# Patient Record
Sex: Male | Born: 1950 | Race: Asian | Hispanic: No | Marital: Married | Smoking: Never smoker
Health system: Southern US, Community
[De-identification: ages and names within clinical notes are randomized; demographics above are authoritative.]

## PROBLEM LIST (undated history)

## (undated) DIAGNOSIS — E7800 Pure hypercholesterolemia, unspecified: Secondary | ICD-10-CM

## (undated) DIAGNOSIS — E119 Type 2 diabetes mellitus without complications: Secondary | ICD-10-CM

## (undated) DIAGNOSIS — I10 Essential (primary) hypertension: Secondary | ICD-10-CM

## (undated) DIAGNOSIS — I2510 Atherosclerotic heart disease of native coronary artery without angina pectoris: Secondary | ICD-10-CM

## (undated) HISTORY — PX: CORONARY ANGIOPLASTY WITH STENT PLACEMENT: SHX49

---

## 2016-11-05 ENCOUNTER — Emergency Department (HOSPITAL_COMMUNITY): Payer: Self-pay

## 2016-11-05 ENCOUNTER — Encounter (HOSPITAL_COMMUNITY): Payer: Self-pay | Admitting: *Deleted

## 2016-11-05 ENCOUNTER — Observation Stay (HOSPITAL_COMMUNITY)
Admission: EM | Admit: 2016-11-05 | Discharge: 2016-11-06 | Disposition: A | Discharge status: Home / Self Care | Payer: Self-pay | Attending: Internal Medicine | Admitting: Internal Medicine

## 2016-11-05 DIAGNOSIS — R079 Chest pain, unspecified: Principal | ICD-10-CM | POA: Insufficient documentation

## 2016-11-05 DIAGNOSIS — F419 Anxiety disorder, unspecified: Secondary | ICD-10-CM | POA: Insufficient documentation

## 2016-11-05 DIAGNOSIS — N289 Disorder of kidney and ureter, unspecified: Secondary | ICD-10-CM | POA: Insufficient documentation

## 2016-11-05 DIAGNOSIS — E119 Type 2 diabetes mellitus without complications: Secondary | ICD-10-CM | POA: Diagnosis not present

## 2016-11-05 DIAGNOSIS — E785 Hyperlipidemia, unspecified: Secondary | ICD-10-CM | POA: Insufficient documentation

## 2016-11-05 DIAGNOSIS — F32A Depression, unspecified: Secondary | ICD-10-CM | POA: Diagnosis present

## 2016-11-05 DIAGNOSIS — Z955 Presence of coronary angioplasty implant and graft: Secondary | ICD-10-CM | POA: Insufficient documentation

## 2016-11-05 DIAGNOSIS — E7800 Pure hypercholesterolemia, unspecified: Secondary | ICD-10-CM | POA: Insufficient documentation

## 2016-11-05 DIAGNOSIS — R918 Other nonspecific abnormal finding of lung field: Secondary | ICD-10-CM | POA: Insufficient documentation

## 2016-11-05 DIAGNOSIS — I10 Essential (primary) hypertension: Secondary | ICD-10-CM | POA: Insufficient documentation

## 2016-11-05 DIAGNOSIS — I25119 Atherosclerotic heart disease of native coronary artery with unspecified angina pectoris: Secondary | ICD-10-CM | POA: Diagnosis not present

## 2016-11-05 DIAGNOSIS — Z7982 Long term (current) use of aspirin: Secondary | ICD-10-CM | POA: Insufficient documentation

## 2016-11-05 DIAGNOSIS — F329 Major depressive disorder, single episode, unspecified: Secondary | ICD-10-CM | POA: Diagnosis not present

## 2016-11-05 DIAGNOSIS — I252 Old myocardial infarction: Secondary | ICD-10-CM | POA: Insufficient documentation

## 2016-11-05 DIAGNOSIS — Z7902 Long term (current) use of antithrombotics/antiplatelets: Secondary | ICD-10-CM | POA: Insufficient documentation

## 2016-11-05 DIAGNOSIS — Z8679 Personal history of other diseases of the circulatory system: Secondary | ICD-10-CM

## 2016-11-05 DIAGNOSIS — K219 Gastro-esophageal reflux disease without esophagitis: Secondary | ICD-10-CM | POA: Diagnosis present

## 2016-11-05 DIAGNOSIS — Z7984 Long term (current) use of oral hypoglycemic drugs: Secondary | ICD-10-CM | POA: Insufficient documentation

## 2016-11-05 DIAGNOSIS — I2510 Atherosclerotic heart disease of native coronary artery without angina pectoris: Secondary | ICD-10-CM | POA: Insufficient documentation

## 2016-11-05 HISTORY — DX: Essential (primary) hypertension: I10

## 2016-11-05 HISTORY — DX: Atherosclerotic heart disease of native coronary artery without angina pectoris: I25.10

## 2016-11-05 HISTORY — DX: Type 2 diabetes mellitus without complications: E11.9

## 2016-11-05 HISTORY — DX: Pure hypercholesterolemia, unspecified: E78.00

## 2016-11-05 LAB — CBC
HEMATOCRIT: 41.9 % (ref 39.0–52.0)
Hemoglobin: 13.6 g/dL (ref 13.0–17.0)
MCH: 27.1 pg (ref 26.0–34.0)
MCHC: 32.5 g/dL (ref 30.0–36.0)
MCV: 83.5 fL (ref 78.0–100.0)
Platelets: 275 10*3/uL (ref 150–400)
RBC: 5.02 MIL/uL (ref 4.22–5.81)
RDW: 13.7 % (ref 11.5–15.5)
WBC: 9.2 10*3/uL (ref 4.0–10.5)

## 2016-11-05 LAB — BASIC METABOLIC PANEL
Anion gap: 10 (ref 5–15)
BUN: 17 mg/dL (ref 6–20)
CHLORIDE: 106 mmol/L (ref 101–111)
CO2: 27 mmol/L (ref 22–32)
Calcium: 10 mg/dL (ref 8.9–10.3)
Creatinine, Ser: 1.3 mg/dL — ABNORMAL HIGH (ref 0.61–1.24)
GFR calc Af Amer: >60 mL/min (ref >60)
GFR calc non Af Amer: 56 mL/min — ABNORMAL LOW (ref >60)
GLUCOSE: 78 mg/dL (ref 65–99)
POTASSIUM: 3.6 mmol/L (ref 3.5–5.1)
Sodium: 143 mmol/L (ref 135–145)

## 2016-11-05 LAB — I-STAT TROPONIN, ED: Troponin i, poc: 0.01 ng/mL (ref 0.00–0.08)

## 2016-11-05 MED ORDER — ATORVASTATIN CALCIUM 80 MG PO TABS
80.0000 mg | ORAL_TABLET | Freq: Every day | ORAL | Status: DC
  Administered 2016-11-06: 80 mg via ORAL
  Filled 2016-11-05: qty 1

## 2016-11-05 MED ORDER — FAMOTIDINE IN NACL 20-0.9 MG/50ML-% IV SOLN
20.0000 mg | Freq: Two times a day (BID) | INTRAVENOUS | Status: DC
  Administered 2016-11-06 (×2): 20 mg via INTRAVENOUS
  Filled 2016-11-05 (×3): qty 50

## 2016-11-05 MED ORDER — ASPIRIN 81 MG PO CHEW
324.0000 mg | CHEWABLE_TABLET | Freq: Once | ORAL | Status: AC
  Administered 2016-11-05: 324 mg via ORAL
  Filled 2016-11-05: qty 4

## 2016-11-05 MED ORDER — LABETALOL HCL 5 MG/ML IV SOLN: 5.0000 mg | INTRAVENOUS | Status: DC | PRN

## 2016-11-05 MED ORDER — ISOSORBIDE MONONITRATE ER 30 MG PO TB24
30.0000 mg | ORAL_TABLET | Freq: Every day | ORAL | Status: DC
  Administered 2016-11-06: 30 mg via ORAL
  Filled 2016-11-05: qty 1

## 2016-11-05 MED ORDER — ESCITALOPRAM OXALATE 10 MG PO TABS
5.0000 mg | ORAL_TABLET | Freq: Every day | ORAL | Status: DC
  Administered 2016-11-06: 5 mg via ORAL
  Filled 2016-11-05: qty 1

## 2016-11-05 MED ORDER — NITROGLYCERIN 0.4 MG SL SUBL
0.4000 mg | SUBLINGUAL_TABLET | SUBLINGUAL | Status: DC | PRN
  Administered 2016-11-05: 0.4 mg via SUBLINGUAL
  Filled 2016-11-05: qty 1

## 2016-11-05 MED ORDER — METOPROLOL SUCCINATE ER 25 MG PO TB24
25.0000 mg | ORAL_TABLET | Freq: Every day | ORAL | Status: DC
  Administered 2016-11-06: 25 mg via ORAL
  Filled 2016-11-05 (×2): qty 1

## 2016-11-05 MED ORDER — NITROGLYCERIN 0.4 MG SL SUBL: 0.4000 mg | SUBLINGUAL_TABLET | SUBLINGUAL | Status: DC | PRN

## 2016-11-05 MED ORDER — ONDANSETRON HCL 4 MG/2ML IJ SOLN: 4.0000 mg | Freq: Four times a day (QID) | INTRAMUSCULAR | Status: DC | PRN

## 2016-11-05 MED ORDER — ASPIRIN EC 81 MG PO TBEC
81.0000 mg | DELAYED_RELEASE_TABLET | Freq: Every day | ORAL | Status: DC
  Administered 2016-11-06: 81 mg via ORAL
  Filled 2016-11-05: qty 1

## 2016-11-05 MED ORDER — ENOXAPARIN SODIUM 40 MG/0.4ML ~~LOC~~ SOLN
40.0000 mg | Freq: Every day | SUBCUTANEOUS | Status: DC
  Administered 2016-11-06: 40 mg via SUBCUTANEOUS
  Filled 2016-11-05: qty 0.4

## 2016-11-05 MED ORDER — CLOPIDOGREL BISULFATE 75 MG PO TABS
75.0000 mg | ORAL_TABLET | Freq: Every day | ORAL | Status: DC
  Administered 2016-11-06: 75 mg via ORAL
  Filled 2016-11-05: qty 1

## 2016-11-05 MED ORDER — INSULIN ASPART 100 UNIT/ML ~~LOC~~ SOLN: 0.0000 [IU] | Freq: Every day | SUBCUTANEOUS | Status: DC

## 2016-11-05 MED ORDER — ACETAMINOPHEN 325 MG PO TABS
650.0000 mg | ORAL_TABLET | ORAL | Status: DC | PRN
Start: 2016-11-05 — End: 2016-11-06

## 2016-11-05 MED ORDER — INSULIN ASPART 100 UNIT/ML ~~LOC~~ SOLN: 0.0000 [IU] | Freq: Three times a day (TID) | SUBCUTANEOUS | Status: DC

## 2016-11-05 NOTE — ED Triage Notes (Signed)
Pt is here from [place] visiting.  He gets his regular medical care [facility] in [place].  Pt began having left sided CP today with some sob and a little nausea.  Pt has HX of cardiac stent placement.

## 2016-11-05 NOTE — ED Notes (Signed)
Eula ListenHussain (son) [phone] requesting call when pt transferred and settled to floor

## 2016-11-05 NOTE — H&P (Signed)
History and Physical    Bruce Nash KYH:[id number] DOB: Dec 30, 1950 DOA: 11/05/2016  PCP: No PCP Per Patient   Patient coming from: Home  Chief Complaint: Chest pain   HPI: Bruce Nash is a 65 y.o. male with medical history significant for coronary artery disease, type 2 diabetes mellitus, hypertension, hyperlipidemia, depression, and GERD who presents to the emergency department with left-sided chest pain that began several hours prior. Patient reports that he is here visiting from [place] and was in his usual state of health until the sudden onset of left-sided chest pain with mild nausea and mild dyspnea. Patient describes the pain as moderate in intensity, heavy and pressure-like in character, radiating to the upper right back, and without diaphoresis. 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 He has occasional angina at home in [place] for which he takes nitroglycerin, but did not have any with him today and so he came into the ED for evaluation. Patient was unable to identify any alleviating or exacerbating factors for his pain and did not attempt any interventions prior to coming in. He denies any recent fevers or chills or any significant cough or dyspnea. There has been no extremity swelling, palpitations, or personal or family history of VTE.  ED Course: Upon arrival to the ED, patient is found to be afebrile, saturating well on room air, hypertensive to 170/105, and with vitals otherwise stable. EKG reveals a normal sinus rhythm and appears to be in the normal EKG. Chest x-ray is notable only for a mild nonspecific opacity in the left lateral lung base with short-term follow-up recommended. Chemistry panels notable for a serum creatinine of 1.30 with no prior available for comparison. CBC is unremarkable and troponin returns within the  normal limits at 0.01. Patient was treated with 324 mg aspirin in the emergency department and his pain, which had already been spontaneously improving, completely resolved. Patient has remained hemodynamically stable in the emergency department and there has been no apparent respiratory distress. Cardiology was consulted by the ED physician and advises a medical admission for ACS rule-out.  Review of Systems:  All other systems reviewed and apart from HPI, are negative.  Past Medical History:  Diagnosis Date  . CAD (coronary artery disease)   . Diabetes mellitus without complication ([place])   . Elevated cholesterol   . Hypertension     Past Surgical History:  Procedure Laterality Date  . CORONARY ANGIOPLASTY WITH STENT PLACEMENT       reports that he has never smoked. He has never used smokeless tobacco. He reports that he does not drink alcohol. His drug history is not on file.  No Known Allergies  History reviewed. No pertinent family history.   Prior to Admission medications   Medication Sig Start Date End Date Taking? Authorizing Provider  aspirin EC 81 MG tablet Take 81 mg by mouth daily.   Yes Historical Provider, MD  atorvastatin (LIPITOR) 80 MG tablet Take 80 mg by mouth daily.   Yes Historical Provider, MD  clopidogrel (PLAVIX) 75 MG tablet Take 75 mg by mouth daily.   Yes Historical Provider, MD  escitalopram (LEXAPRO) 5 MG tablet Take 5 mg by mouth daily.   Yes Historical Provider, MD  glipiZIDE (GLUCOTROL XL) 5 MG 24 hr tablet Take 5 mg by mouth daily with breakfast.   Yes Historical Provider, MD  isosorbide mononitrate (IMDUR) 30 MG 24 hr tablet Take 30 mg by mouth daily.   Yes Historical Provider, MD  lisinopril-hydrochlorothiazide (PRINZIDE,ZESTORETIC) 20-12.5 MG tablet Take 1 tablet by mouth daily.   Yes Historical Provider, MD  metFORMIN (GLUCOPHAGE) 500 MG tablet Take 500 mg by mouth 2 (two) times daily with a meal.   Yes Historical Provider, MD  metoprolol  succinate (TOPROL-XL) 25 MG 24 hr tablet Take 25 mg by mouth daily.   Yes Historical Provider, MD  omeprazole (PRILOSEC) 20 MG capsule Take 20 mg by mouth daily as needed (acid reflux).   Yes Historical Provider, MD    Physical Exam: Vitals:   11/05/16 2200 11/05/16 2215 11/05/16 2230 11/05/16 2245  BP: 160/91 141/90 155/97 146/88  Pulse: 89 92 91 88  Resp: _0 Temp:      TempSrc:      SpO2: 99% 95% 97% 100%  Weight:          Constitutional: NAD, calm, comfortable Eyes: PERTLA, lids and conjunctivae normal ENMT: Mucous membranes are moist. Posterior pharynx clear of any exudate or lesions.   Neck: normal, supple, no masses, no thyromegaly Respiratory: clear to auscultation bilaterally, no wheezing, no crackles. Normal respiratory effort.  Cardiovascular: S1 & S2 heard, regular rate and rhythm, soft systolic murmur at apex. No extremity edema. No significant JVD. Abdomen: No distension, no tenderness, no masses palpated. Bowel sounds normal.  Musculoskeletal: no clubbing / cyanosis. No joint deformity upper and lower extremities. Normal muscle tone.  Skin: no significant rashes, lesions, ulcers. Warm, dry, well-perfused. Neurologic: CN 2-12 grossly intact. Sensation intact, DTR normal. Strength 5/5 in all 4 limbs.  Psychiatric: Normal judgment and insight. Alert and oriented x 3. Normal mood and affect.     Labs on Admission: I have personally reviewed following labs and imaging studies  CBC:  Recent Labs Lab 11/05/16 2014  WBC 9.2  HGB 13.6  HCT 41.9  MCV 83.5  PLT 272   Basic Metabolic Panel:  Recent Labs Lab 11/05/16 2014  NA 143  K 3.6  CL 106  CO2 27  GLUCOSE 78  BUN 17  CREATININE 1.30*  CALCIUM 10.0   GFR: CrCl cannot be calculated (Unknown ideal weight.). Liver Function Tests: No results for input(s): AST, ALT, ALKPHOS, BILITOT, PROT, ALBUMIN in the last 168 hours. No results for input(s): LIPASE, AMYLASE in the last 168 hours. No  results for input(s): AMMONIA in the last 168 hours. Coagulation Profile: No results for input(s): INR, PROTIME in the last 168 hours. Cardiac Enzymes: No results for input(s): CKTOTAL, CKMB, CKMBINDEX, TROPONINI in the last 168 hours. BNP (last 3 results) No results for input(s): PROBNP in the last 8760 hours. HbA1C: No results for input(s): HGBA1C in the last 72 hours. CBG: No results for input(s): GLUCAP in the last 168 hours. Lipid Profile: No results for input(s): CHOL, HDL, LDLCALC, TRIG, CHOLHDL, LDLDIRECT in the last 72 hours. Thyroid Function Tests: No results for input(s): TSH, T4TOTAL, FREET4, T3FREE, THYROIDAB in the last 72 hours. Anemia Panel: No results for input(s): VITAMINB12, FOLATE, FERRITIN, TIBC, IRON, RETICCTPCT in the last 72 hours. Urine analysis: No results found for: COLORURINE, APPEARANCEUR, LABSPEC, PHURINE, GLUCOSEU, HGBUR, BILIRUBINUR, KETONESUR, PROTEINUR, UROBILINOGEN, NITRITE, LEUKOCYTESUR Sepsis Labs: _1 (procalcitonin:4,lacticidven:4) )No results found for this or any previous visit (from the past 240 hour(s)).   Radiological Exams on Admission: Dg Chest 2 View  Result Date: 11/05/2016 CLINICAL DATA:  Chest pain. EXAM: CHEST  2 VIEW COMPARISON:  None. FINDINGS: Scarring in the right apex. Mild opacity in the lateral left lung base is nonspecific but may represent scarring or atelectasis. The heart, hila, and mediastinum are normal. No other acute abnormalities. IMPRESSION: Mild nonspecific opacity in the lateral left lung base. Recommend comparison to outside films if available to assess stability. If none are available, recommend a short-term follow-up. Probable scarring in the right apex. Electronically Signed   By: Dorise Bullion III M.D   On: 11/05/2016 20:50    EKG: Independently reviewed. Normal sinus rhythm, normal EKG  Assessment/Plan  1. Chest pain, CAD  - Pt reports hx of CAD with stent placed 3 yrs ago and 18 mos ago in [place] he [place]  - He is managed at home with ASA 81, Plavix, Lipitor, Imdur, lisinopril, and Toprol - He developed CP several hrs PTA, described as very similar as sxs experienced with MI - Initial EKG and troponin are reassuring; no acute ischemic features to EKG and troponin is 0.01 - ASA 324 mg given in ED  - Cardiology is consulting and much appreciated, will follow-up recommendations  - Plan to monitor on telemetry for ischemic changes, obtain serial troponin measurements, repeat EKG in am (sooner for recurrent pain), and continue ASA, Plavix, statin, nitrate, and metoprolol    2. Hypertension  - Modestly elevated in ED, will treat now with beta-blocker  - Managed at home with Toprol, lisinopril-HCTZ; lisinopril-HCTZ held on admission given kidney disease of uncertain chronicity  - Labetalol IVPs available prn   3. Type II DM  - No A1c on file  - Managed with metformin and glipizide at home; these are held on admission  - Check CBG with meals and qHS - Start with a low-intensity SSI only, adjust prn   4. Kidney disease of uncertain chronicity - SCr is 1.30 on admission, corresponding to eGFR in mid 50's; no prior available  - Pt appears euvolemic on admission; plan to hold lisinopril and HCTZ for now and repeat chemistries in am    5. GERD - Managed with Prilosec at home - Will treat with IV Pepcid now given possible GI etiology for presenting complaints   6. Depression, anxiety  - Appears to be stable on admission; pt reports is stable  - Continue Lexapro    7. Non-specific opacity on CXR  - There is a subtle opacity in left lateral lung base on CXR with no priors available for comparison  - Discussed with patient that he will need close follow-up on this; he expresses understanding   DVT prophylaxis: sq Lovenox  Code Status: Full  Family Communication: Discussed with patient Disposition Plan: Observe on telemetry Consults called: Cardiology  Admission status:  Observation    Vianne Bulls, MD [facility] Pager [phone]  If 7PM-7AM, please contact night-coverage [email or web address] Password [facility]  11/05/2016, 11:27 PM

## 2016-11-05 NOTE — ED Provider Notes (Signed)
[facility]-EMERGENCY DEPT Provider Note   CSN: [id number] Arrival date & time: 11/05/16  2001     History   Chief Complaint Chief Complaint  Patient presents with  . Chest Pain    HPI Bruce Nash is a 65 y.o. male.  The history is provided by the patient, a relative and medical records. No language interpreter was used.  Chest Pain   This is a recurrent (states feels similar to prior ACS, has history of stents) problem. The current episode started 3 to 5 hours ago. The problem has not changed since onset.The pain is associated with rest. The pain is present in the substernal region (L side). The pain is moderate. The quality of the pain is described as heavy and pressure-like. The pain radiates to the upper back. Associated symptoms include nausea and shortness of breath. Pertinent negatives include no abdominal pain, no cough, no dizziness, no fever, no headaches, no lower extremity edema, no numbness, no syncope and no vomiting. He has tried nothing (was at [employer] first birthday party and did not have nitro with him) for the symptoms. Risk factors include male gender.  His past medical history is significant for CAD.  Procedure history is positive for cardiac catheterization (has prior stents placed at [facility]).    Past Medical History:  Diagnosis Date  . CAD (coronary artery disease)   . Diabetes mellitus without complication ([facility])   . Elevated cholesterol   . Hypertension     Patient Active Problem List   Diagnosis Date Noted  . Chest pain 11/05/2016  . CAD (coronary artery disease) 11/05/2016  . HLD (hyperlipidemia) 11/05/2016  . HTN (hypertension) 11/05/2016  . Kidney disease 11/05/2016  . Opacity of lung on imaging study 11/05/2016  . Depression 11/05/2016  . GERD (gastroesophageal reflux disease) 11/05/2016  . Diabetes mellitus type II, non insulin dependent (HCC) 11/05/2016    Past Surgical History:  Procedure Laterality Date  . CORONARY  ANGIOPLASTY WITH STENT PLACEMENT         Home Medications    Prior to Admission medications   Medication Sig Start Date End Date Taking? Authorizing Provider  aspirin EC 81 MG tablet Take 81 mg by mouth daily.   Yes Historical Provider, MD  atorvastatin (LIPITOR) 80 MG tablet Take 80 mg by mouth daily.   Yes Historical Provider, MD  clopidogrel (PLAVIX) 75 MG tablet Take 75 mg by mouth daily.   Yes Historical Provider, MD  escitalopram (LEXAPRO) 5 MG tablet Take 5 mg by mouth daily.   Yes Historical Provider, MD  glipiZIDE (GLUCOTROL XL) 5 MG 24 hr tablet Take 5 mg by mouth daily with breakfast.   Yes Historical Provider, MD  isosorbide mononitrate (IMDUR) 30 MG 24 hr tablet Take 30 mg by mouth daily.   Yes Historical Provider, MD  lisinopril-hydrochlorothiazide (PRINZIDE,ZESTORETIC) 20-12.5 MG tablet Take 1 tablet by mouth daily.   Yes Historical Provider, MD  metFORMIN (GLUCOPHAGE) 500 MG tablet Take 500 mg by mouth 2 (two) times daily with a meal.   Yes Historical Provider, MD  metoprolol succinate (TOPROL-XL) 25 MG 24 hr tablet Take 25 mg by mouth daily.   Yes Historical Provider, MD  omeprazole (PRILOSEC) 20 MG capsule Take 20 mg by mouth daily as needed (acid reflux).   Yes Historical Provider, MD    Family History History reviewed. No pertinent family history.  Social History Social History  Substance Use Topics  . Smoking status: Never Smoker  . Smokeless tobacco: Never  Used  . Alcohol use No     Allergies   Patient has no known allergies.   Review of Systems Review of Systems  Constitutional: Negative for chills and fever.  HENT: Negative for congestion and rhinorrhea.   Respiratory: Positive for shortness of breath. Negative for cough.   Cardiovascular: Positive for chest pain. Negative for syncope.  Gastrointestinal: Positive for nausea. Negative for abdominal pain and vomiting.  Genitourinary: Negative for dysuria and hematuria.  Skin: Negative for pallor.    Neurological: Negative for dizziness, numbness and headaches.  Psychiatric/Behavioral: Negative for agitation and confusion.  All other systems reviewed and are negative.    Physical Exam Updated Vital Signs BP 122/70   Pulse 103   Temp 98 F (36.7 C) (Oral)   Resp 18   Wt 76.2 kg   SpO2 100%   Physical Exam  Constitutional: He is oriented to person, place, and time. No distress.  HENT:  Head: Normocephalic and atraumatic.  Eyes: Conjunctivae and EOM are normal.  Neck: Normal range of motion. Neck supple.  Cardiovascular: Normal rate, regular rhythm and normal heart sounds.   Pulmonary/Chest: Breath sounds normal. No respiratory distress.  Abdominal: Soft. He exhibits no distension. There is no tenderness.  Musculoskeletal: Normal range of motion. He exhibits no edema.  Neurological: He is alert and oriented to person, place, and time.  Skin: Skin is warm and dry. He is not diaphoretic.  Nursing note and vitals reviewed.    ED Treatments / Results  Labs (all labs ordered are listed, but only abnormal results are displayed) Labs Reviewed  BASIC METABOLIC PANEL - Abnormal; Notable for the following:       Result Value   Creatinine, Ser 1.30 (*)    GFR calc non Af Amer 56 (*)    All other components within normal limits  CBC  TROPONIN I  TROPONIN I  TROPONIN I  BRAIN NATRIURETIC PEPTIDE  BASIC METABOLIC PANEL  I-STAT TROPOININ, ED    EKG  EKG Interpretation  Date/Time:  Saturday November 05 2016 20:07:06 EST Ventricular Rate:  83 PR Interval:  156 QRS Duration: 74 QT Interval:  342 QTC Calculation: 401 R Axis:   76 Text Interpretation:  Normal sinus rhythm Normal ECG Normal ECG Confirmed by Gerhard Munch  MD ([id number]) on 11/05/2016 8:36:11 PM       Radiology Dg Chest 2 View  Result Date: 11/05/2016 CLINICAL DATA:  Chest pain. EXAM: CHEST  2 VIEW COMPARISON:  None. FINDINGS: Scarring in the right apex. Mild opacity in the lateral left lung base is  nonspecific but may represent scarring or atelectasis. The heart, hila, and mediastinum are normal. No other acute abnormalities. IMPRESSION: Mild nonspecific opacity in the lateral left lung base. Recommend comparison to outside films if available to assess stability. If none are available, recommend a short-term follow-up. Probable scarring in the right apex. Electronically Signed   By: Gerome Sam III M.D   On: 11/05/2016 20:50    Procedures Procedures (including critical care time)  Medications Ordered in ED Medications  aspirin EC tablet 81 mg (not administered)  atorvastatin (LIPITOR) tablet 80 mg (not administered)  clopidogrel (PLAVIX) tablet 75 mg (not administered)  escitalopram (LEXAPRO) tablet 5 mg (not administered)  isosorbide mononitrate (IMDUR) 24 hr tablet 30 mg (not administered)  metoprolol succinate (TOPROL-XL) 24 hr tablet 25 mg (not administered)  nitroGLYCERIN (NITROSTAT) SL tablet 0.4 mg (not administered)  acetaminophen (TYLENOL) tablet 650 mg (not administered)  ondansetron (ZOFRAN) injection  4 mg (not administered)  enoxaparin (LOVENOX) injection 40 mg (not administered)  insulin aspart (novoLOG) injection 0-9 Units (not administered)  insulin aspart (novoLOG) injection 0-5 Units (not administered)  labetalol (NORMODYNE,TRANDATE) injection 5 mg (not administered)  famotidine (PEPCID) IVPB 20 mg premix (not administered)  aspirin chewable tablet 324 mg (324 mg Oral Given 11/05/16 2125)     Initial Impression / Assessment and Plan / ED Course  I have reviewed the triage vital signs and the nursing notes.  Pertinent labs & imaging results that were available during my care of the patient were reviewed by me and considered in my medical decision making (see chart for details).  Clinical Course      Patient is a 65 year old male with a past medical history of coronary artery disease who presents today with chest pain that started earlier this evening. On  my exam, the patient appears slightly uncomfortable but is in no acute distress. I reviewed his EKG and there is no evidence of ST elevation, depression, or pathological T-wave inversions. He is in normal sinus rhythm. He has normal R-wave progression. Normal axis.  Given his high risk I discussed with cardiology. They will see the patient tonight consultation and recommended inpatient evaluation for ACS. I discussed this with the patient was agreeable. Discussed with hospitalist who will that the patient to a telemetry bed. Patient is stable at time of admission.  Final Clinical Impressions(s) / ED Diagnoses   Final diagnoses:  None    New Prescriptions New Prescriptions   No medications on file     Madolyn FriezeVijay Vandy Fong, MD 11/06/16 0025    Gerhard Munchobert Lockwood, MD 11/06/16 0028

## 2016-11-06 DIAGNOSIS — N289 Disorder of kidney and ureter, unspecified: Secondary | ICD-10-CM

## 2016-11-06 DIAGNOSIS — E119 Type 2 diabetes mellitus without complications: Secondary | ICD-10-CM

## 2016-11-06 DIAGNOSIS — Z8679 Personal history of other diseases of the circulatory system: Secondary | ICD-10-CM

## 2016-11-06 DIAGNOSIS — E785 Hyperlipidemia, unspecified: Secondary | ICD-10-CM

## 2016-11-06 DIAGNOSIS — R918 Other nonspecific abnormal finding of lung field: Secondary | ICD-10-CM

## 2016-11-06 DIAGNOSIS — R079 Chest pain, unspecified: Secondary | ICD-10-CM

## 2016-11-06 DIAGNOSIS — I10 Essential (primary) hypertension: Secondary | ICD-10-CM

## 2016-11-06 DIAGNOSIS — I25119 Atherosclerotic heart disease of native coronary artery with unspecified angina pectoris: Secondary | ICD-10-CM

## 2016-11-06 DIAGNOSIS — I2510 Atherosclerotic heart disease of native coronary artery without angina pectoris: Secondary | ICD-10-CM

## 2016-11-06 LAB — TROPONIN I: Troponin I: <0.03 ng/mL (ref <0.03)

## 2016-11-06 LAB — BRAIN NATRIURETIC PEPTIDE: B NATRIURETIC PEPTIDE 5: 70.3 pg/mL (ref 0.0–100.0)

## 2016-11-06 LAB — BASIC METABOLIC PANEL
ANION GAP: 11 (ref 5–15)
BUN: 14 mg/dL (ref 6–20)
CALCIUM: 9 mg/dL (ref 8.9–10.3)
CO2: 20 mmol/L — ABNORMAL LOW (ref 22–32)
Chloride: 107 mmol/L (ref 101–111)
Creatinine, Ser: 1.05 mg/dL (ref 0.61–1.24)
GFR calc Af Amer: >60 mL/min (ref >60)
GLUCOSE: 81 mg/dL (ref 65–99)
POTASSIUM: 3.5 mmol/L (ref 3.5–5.1)
SODIUM: 138 mmol/L (ref 135–145)

## 2016-11-06 LAB — GLUCOSE, CAPILLARY
GLUCOSE-CAPILLARY: 153 mg/dL — AB (ref 65–99)
Glucose-Capillary: 95 mg/dL (ref 65–99)
Glucose-Capillary: 68 mg/dL (ref 65–99)

## 2016-11-06 MED ORDER — NITROGLYCERIN 0.4 MG SL SUBL: 0.4000 mg | SUBLINGUAL_TABLET | SUBLINGUAL | 0 refills | Status: AC | PRN

## 2016-11-06 NOTE — ED Notes (Signed)
Report called and given to 2W

## 2016-11-06 NOTE — Consult Note (Signed)
Patient ID: Linden DolinShabbir Yarborough MRN: [id number], DOB/AGE: 1951/09/07  Admit date: 11/05/2016 Primary Physician: No PCP Per Patient  CARDIOLOGY CONSULT NOTE   PATIENT PROFILE   65 yo M with history of CAD, TMII, HTN, HLD who p/w chest pain. Patient resides in [place] and is here visiting family. He states he had PCI x 2 - once three years ago, and again two years ago. He does not know the location of the stents or the type. These were performed at [facility] in [place].   HISTORY OF PRESENT ILLNESS   He tells me that today while at a party for his family member he experienced sudden onset L sided chest pain with mild diaphoresis. No radiation. He felt a bit unwell so he decided to lay down for a couple hours, but when he awoke the chest discomfort was still there. At that time he called EMS. After Aspirin and Nitro x 1, chest pain resolved (lasted a total of 4-5 hours). He normally has nitro with him, but he had left it at home. It has been several months since he needed nitro. He states that a few months ago [facility] with similar chest pain, and was ruled out after a normal stress test (no repeat cath). He states that his symptoms today were similar to his previous episodes for which he received PCI except the chest pain was not as intense, and with those episodes he had much more severe nausea.  At baseline he is very active, walking 3-4 miles daily. He also frequently has to walk stairs. He has no angina usually. No limitations to his exertional status. No issues with bleeding (still on DAPT). No syncope, presyncope, orthopnea, palpitations, LE edema.  In ED, troponin was negative and ECG showed no ischemic changes. He is electrically and hemodynamically stable.  Review of Systems General:  No chills, fever, night sweats or weight changes.  Cardiovascular:  See HPI Dermatological: No rash, lesions/masses Respiratory: No cough, dyspnea Urologic: No hematuria,  dysuria Abdominal:   No nausea, vomiting, diarrhea, bright red blood per rectum, melena, or hematemesis Neurologic:  No visual changes, wkns, changes in mental status. All other systems reviewed and are otherwise negative except as noted above.   MEDICAL, FAMILY, AND SOCIAL HISTORY   Past Medical History:  Diagnosis Date  . CAD (coronary artery disease)   . Diabetes mellitus without complication (HCC)   . Elevated cholesterol   . Hypertension     Past Surgical History:  Procedure Laterality Date  . CORONARY ANGIOPLASTY WITH STENT PLACEMENT      No Known Allergies Prior to Admission medications   Medication Sig Start Date End Date Taking? Authorizing Provider  aspirin EC 81 MG tablet Take 81 mg by mouth daily.   Yes Historical Provider, MD  atorvastatin (LIPITOR) 80 MG tablet Take 80 mg by mouth daily.   Yes Historical Provider, MD  clopidogrel (PLAVIX) 75 MG tablet Take 75 mg by mouth daily.   Yes Historical Provider, MD  escitalopram (LEXAPRO) 5 MG tablet Take 5 mg by mouth daily.   Yes Historical Provider, MD  glipiZIDE (GLUCOTROL XL) 5 MG 24 hr tablet Take 5 mg by mouth daily with breakfast.   Yes Historical Provider, MD  isosorbide mononitrate (IMDUR) 30 MG 24 hr tablet Take 30 mg by mouth daily.   Yes Historical Provider, MD  lisinopril-hydrochlorothiazide (PRINZIDE,ZESTORETIC) 20-12.5 MG tablet Take 1 tablet by mouth daily.   Yes Historical Provider, MD  metFORMIN (GLUCOPHAGE) 500 MG tablet Take 500 mg by mouth 2 (two) times daily with a meal.   Yes Historical Provider, MD  metoprolol succinate (TOPROL-XL) 25 MG 24 hr tablet Take 25 mg by mouth daily.   Yes Historical Provider, MD  omeprazole (PRILOSEC) 20 MG capsule Take 20 mg by mouth daily as needed (acid reflux).   Yes Historical Provider, MD   History reviewed. No pertinent family history. Social History   Social History  . Marital status: Married    Spouse name: N/A  . Number of children: N/A  . Years of education:  N/A   Occupational History  . Not on file.   Social History Main Topics  . Smoking status: Never Smoker  . Smokeless tobacco: Never Used  . Alcohol use No  . Drug use: Unknown  . Sexual activity: Not on file   Other Topics Concern  . Not on file   Social History Narrative  . No narrative on file      PHYSICAL EXAM  Blood pressure 131/69, pulse 98, temperature 97.6 F (36.4 C), temperature source Axillary, resp. rate 16, weight 75.2 kg (165 lb 11.2 oz), SpO2 100 %.  General: Pleasant, NAD Psych: Normal affect. Neuro: Alert and oriented X 3. Moves all extremities spontaneously. HEENT: Sclera anicteric, noninjected. MMM.  Neck: no bruits. JVP is not elevated. Lungs:  CTA, normal WOB Heart: RRR no m/r/g Abdomen: Soft, non-tender, non-distended, BS + x 4.  Extremities: No clubbing or cyanosis. Edema: no. DP/PT/Radials 2+ and equal bilaterally.   LABS and STUDIES  Troponin (Point of Care Test)  Recent Labs  11/05/16 2038  TROPIPOC 0.01   No results for input(s): CKTOTAL, CKMB, TROPONINI in the last 72 hours. Lab Results  Component Value Date   WBC 9.2 11/05/2016   HGB 13.6 11/05/2016   HCT 41.9 11/05/2016   MCV 83.5 11/05/2016   PLT 275 11/05/2016    Recent Labs Lab 11/05/16 2014  NA 143  K 3.6  CL 106  CO2 27  BUN 17  CREATININE 1.30*  CALCIUM 10.0  GLUCOSE 78   No results found for: CHOL, HDL, LDLCALC, TRIG No results found for: DDIMER   Radiology/Studies. Independently Reviewed Opacity in left lung base. Otherwise clear lung fields.  ECG was independently reviewed. NSR 83 bpm, no significant abnormalities   ASSESSMENT and RECOMMENDATIONS   65 yo M with history of CAD, TMII, HTN, HLD who p/w chest pain  Chest Pain. Now chest pain free. Hemodynamically and electrically stable. Agree with obs admission for ACS rule out. - Lovenox for anticoag - Trend troponin - Repeat ECG for chest pain - Continue DAPT (ASA, Plavix), Atorvastatin, Imdur,  metoprolol - Checking HgbA1C - Trending renal function given Cr 1.3 on admission (no priors available). Holding Lisinopril - Please obtain outside cardiology records if able. If patient has normal recent stress test, this may influence whether or not he needs ischemic evaluation - NPO at midnight   PAULEY, ERIC D, MD 11/06/2016, 1:33 AM

## 2016-11-06 NOTE — Discharge Summary (Signed)
PATIENT DETAILS Name: Bruce Nash Age: 65 y.o. Sex: male Date of Birth: 07/19/1951 MRN: [id number]. Admitting Physician: Briscoe Deutscher, MD PCP:No PCP Per Patient  Admit Date: 11/05/2016 Discharge date: 11/06/2016  Recommendations for Outpatient Follow-up:  1. Follow up with PCP and primary cardiologist in 1 week 2. Repeat CXR in 3-4 weeks  Admitted From:  Home  Disposition: Home  Home Health: No  Equipment/Devices: None  Discharge Condition: Stable  CODE STATUS: FULL CODE  Diet recommendation:  Heart Healthy / Carb Modified  Brief Summary: See H&P, Labs, Consult and Test reports for all details in brief, patient is a 65 yo male with hx of CAD-s/p PCI in [place] presented with chest pain.   Brief Hospital Course: Chest pain:known hx of CAD-no longer with chest pain. Troponin's were negative.Continue with ASA/Plavix/Statin/Metoprolol.Seen by cardiology-per cardiology note-patient not interested in pursuing any further intervention, per cardiology ok to discharge today.Continue with ASA/Plavix/Statin/Metoprolol.Patient instructed to seek immediate medical attention at the nearest ED if his chest pain recurs.  WUJ:[id number] Metoprolol,Imdur  Type 2 DM: CBG's stable-continue SSI-continue to hold Glipizide  Mild opacity in the lateral left lung base: per Radiology report likely scarring-will need repeart CXR or a CT Chest in 3-4 weeks.Currently no indication of infection.Patient was apprised of this finding and recommendation to repeat imaging  Procedures/Studies: None  Discharge Diagnoses:  Principal Problem:   Chest pain Active Problems:   CAD (coronary artery disease)   HLD (hyperlipidemia)   HTN (hypertension)   Kidney disease   Opacity of lung on imaging study   Depression   GERD (gastroesophageal reflux disease)   Diabetes mellitus type II, non insulin dependent (HCC)   History of coronary artery disease   Discharge  Instructions:  Activity:  As tolerated with Full fall precautions use walker/cane & assistance as needed  Discharge Instructions    Call MD for:  severe uncontrolled pain    Complete by:  As directed    Diet - low sodium heart healthy    Complete by:  As directed    Diet Carb Modified    Complete by:  As directed    Discharge instructions    Complete by:  As directed    Follow with Primary MD and Primary Cardiologist in [place]    If you have chest pain again-please seek immediate medical attention at the nearest emergency room  Your chest xray showed mild opacity in the lateral left lung base: per Radiology report likely scarring-please ask your primary MD to need repeart CXR or a CT Chest in 3-4  weeks.  Please get a complete blood count and chemistry panel checked by your Primary MD at your next visit, and again as instructed by your Primary MD.  Get Medicines reviewed and adjusted: Please take all your medications with you for your next visit with your Primary MD  Laboratory/radiological data: Please request your Primary MD to go over all hospital tests and procedure/radiological results at the follow up, please ask your Primary MD to get all Hospital records sent to his/her office.  In some cases, they will be blood work, cultures and biopsy results pending at the time of your discharge. Please request that your primary care M.D. follows up on these results.  Also Note the following: If you experience worsening of your admission symptoms, develop shortness of breath, life threatening emergency, suicidal or homicidal thoughts you must seek medical attention immediately by calling 911 or calling your MD immediately  if symptoms less severe.  You must read complete instructions/literature along with all the possible adverse reactions/side effects for all the Medicines you take and that have been prescribed to you. Take any new Medicines after you have completely understood and accpet  all the possible adverse reactions/side effects.   Do not drive when taking Pain medications or sleeping medications (Benzodaizepines)  Do not take more than prescribed Pain, Sleep and Anxiety Medications. It is not advisable to combine anxiety,sleep and pain medications without talking with your primary care practitioner  Special Instructions: If you have smoked or chewed Tobacco  in the last 2 yrs please stop smoking, stop any regular Alcohol  and or any Recreational drug use.  Wear Seat belts while driving.  Please note: You were cared for by a hospitalist during your hospital stay. Once you are discharged, your primary care physician will handle any further medical issues. Please note that NO REFILLS for any discharge medications will be authorized once you are discharged, as it is imperative that you return to your primary care physician (or establish a relationship with a primary care physician if you do not have one) for your post hospital discharge needs so that they can reassess your need for medications and monitor your lab values.   Increase activity slowly    Complete by:  As directed      Allergies as of 11/06/2016   No Known Allergies     Medication List    TAKE these medications   aspirin EC 81 MG tablet Take 81 mg by mouth daily.   atorvastatin 80 MG tablet Commonly known as:  LIPITOR Take 80 mg by mouth daily.   clopidogrel 75 MG tablet Commonly known as:  PLAVIX Take 75 mg by mouth daily.   escitalopram 5 MG tablet Commonly known as:  LEXAPRO Take 5 mg by mouth daily.   glipiZIDE 5 MG 24 hr tablet Commonly known as:  GLUCOTROL XL Take 5 mg by mouth daily with breakfast.   isosorbide mononitrate 30 MG 24 hr tablet Commonly known as:  IMDUR Take 30 mg by mouth daily.   lisinopril-hydrochlorothiazide 20-12.5 MG tablet Commonly known as:  PRINZIDE,ZESTORETIC Take 1 tablet by mouth daily.   metFORMIN 500 MG tablet Commonly known as:  GLUCOPHAGE Take  500 mg by mouth 2 (two) times daily with a meal.   metoprolol succinate 25 MG 24 hr tablet Commonly known as:  TOPROL-XL Take 25 mg by mouth daily.   nitroGLYCERIN 0.4 MG SL tablet Commonly known as:  NITROSTAT Place 1 tablet (0.4 mg total) under the tongue every 5 (five) minutes x 3 doses as needed for chest pain.   omeprazole 20 MG capsule Commonly known as:  PRILOSEC Take 20 mg by mouth daily as needed (acid reflux).      Follow-up Information    PCP/Cardiology in [place]. Schedule an appointment as soon as possible for a visit in 2 week(s).          No Known Allergies  Consultations:   cardiology   Other Procedures/Studies: Dg Chest 2 View  Result Date: 11/05/2016 CLINICAL DATA:  Chest pain. EXAM: CHEST  2 VIEW COMPARISON:  None. FINDINGS: Scarring in the right apex. Mild opacity in the lateral left lung base is nonspecific but may represent scarring or atelectasis. The heart, hila, and mediastinum are normal. No other acute abnormalities. IMPRESSION: Mild nonspecific opacity in the lateral left lung base. Recommend comparison to outside films if available to assess stability. If none are available, recommend a  short-term follow-up. Probable scarring in the right apex. Electronically Signed   By: Gerome Sam III M.D   On: 11/05/2016 20:50    TODAY-DAY OF DISCHARGE:  Subjective:   Bruce Nash today has no headache,no chest abdominal pain,no new weakness tingling or numbness, feels much better wants to go home today.   Objective:   Blood pressure 116/68, pulse 76, temperature 98 F (36.7 C), temperature source Oral, resp. rate 16, weight 75.2 kg (165 lb 11.2 oz), SpO2 100 %. No intake or output data in the 24 hours ending 11/06/16 1314 Filed Weights   11/05/16 2011 11/06/16 0055  Weight: 76.2 kg (168 lb) 75.2 kg (165 lb 11.2 oz)    Exam: Awake Alert, Oriented *3, No new F.N deficits, Normal affect [place].AT,PERRAL Supple Neck,No JVD, No cervical lymphadenopathy  appriciated.  Symmetrical Chest wall movement, Good air movement bilaterally, CTAB RRR,No Gallops,Rubs or new Murmurs, No Parasternal Heave +ve B.Sounds, Abd Soft, Non tender, No organomegaly appriciated, No rebound -guarding or rigidity. No Cyanosis, Clubbing or edema, No new Rash or bruise   PERTINENT RADIOLOGIC STUDIES: Dg Chest 2 View  Result Date: 11/05/2016 CLINICAL DATA:  Chest pain. EXAM: CHEST  2 VIEW COMPARISON:  None. FINDINGS: Scarring in the right apex. Mild opacity in the lateral left lung base is nonspecific but may represent scarring or atelectasis. The heart, hila, and mediastinum are normal. No other acute abnormalities. IMPRESSION: Mild nonspecific opacity in the lateral left lung base. Recommend comparison to outside films if available to assess stability. If none are available, recommend a short-term follow-up. Probable scarring in the right apex. Electronically Signed   By: Gerome Sam III M.D   On: 11/05/2016 20:50     PERTINENT LAB RESULTS: CBC:  Recent Labs  11/05/16 2014  WBC 9.2  HGB 13.6  HCT 41.9  PLT 275   CMET CMP     Component Value Date/Time   NA 138 11/06/2016 0432   K 3.5 11/06/2016 0432   CL 107 11/06/2016 0432   CO2 20 (L) 11/06/2016 0432   GLUCOSE 81 11/06/2016 0432   BUN 14 11/06/2016 0432   CREATININE 1.05 11/06/2016 0432   CALCIUM 9.0 11/06/2016 0432   GFRNONAA >60 11/06/2016 0432   GFRAA >60 11/06/2016 0432    GFR CrCl cannot be calculated (Unknown ideal weight.). No results for input(s): LIPASE, AMYLASE in the last 72 hours.  Recent Labs  11/06/16 0145 11/06/16 0432 11/06/16 1055  TROPONINI <0.03 <0.03 <0.03   Invalid input(s): POCBNP No results for input(s): DDIMER in the last 72 hours. No results for input(s): HGBA1C in the last 72 hours. No results for input(s): CHOL, HDL, LDLCALC, TRIG, CHOLHDL, LDLDIRECT in the last 72 hours. No results for input(s): TSH, T4TOTAL, T3FREE, THYROIDAB in the last 72  hours.  Invalid input(s): FREET3 No results for input(s): VITAMINB12, FOLATE, FERRITIN, TIBC, IRON, RETICCTPCT in the last 72 hours. Coags: No results for input(s): INR in the last 72 hours.  Invalid input(s): PT Microbiology: No results found for this or any previous visit (from the past 240 hour(s)).  FURTHER DISCHARGE INSTRUCTIONS:  Get Medicines reviewed and adjusted: Please take all your medications with you for your next visit with your Primary MD  Laboratory/radiological data: Please request your Primary MD to go over all hospital tests and procedure/radiological results at the follow up, please ask your Primary MD to get all Hospital records sent to his/her office.  In some cases, they will be blood work, cultures and  biopsy results pending at the time of your discharge. Please request that your primary care M.D. goes through all the records of your hospital data and follows up on these results.  Also Note the following: If you experience worsening of your admission symptoms, develop shortness of breath, life threatening emergency, suicidal or homicidal thoughts you must seek medical attention immediately by calling 911 or calling your MD immediately  if symptoms less severe.  You must read complete instructions/literature along with all the possible adverse reactions/side effects for all the Medicines you take and that have been prescribed to you. Take any new Medicines after you have completely understood and accpet all the possible adverse reactions/side effects.   Do not drive when taking Pain medications or sleeping medications (Benzodaizepines)  Do not take more than prescribed Pain, Sleep and Anxiety Medications. It is not advisable to combine anxiety,sleep and pain medications without talking with your primary care practitioner  Special Instructions: If you have smoked or chewed Tobacco  in the last 2 yrs please stop smoking, stop any regular Alcohol  and or any  Recreational drug use.  Wear Seat belts while driving.  Please note: You were cared for by a hospitalist during your hospital stay. Once you are discharged, your primary care physician will handle any further medical issues. Please note that NO REFILLS for any discharge medications will be authorized once you are discharged, as it is imperative that you return to your primary care physician (or establish a relationship with a primary care physician if you do not have one) for your post hospital discharge needs so that they can reassess your need for medications and monitor your lab values.  Total Time spent coordinating discharge including counseling, education and face to face time equals 25 minutes.  SignedJeoffrey Massed: Jacoya Bauman 11/06/2016 1:14 PM

## 2016-11-06 NOTE — Progress Notes (Signed)
Pt arrived to floor from ED.  Telemetry applied and CCMD notified x 2.  Pt oriented to unit including call light and telephone.  Pt plan of care discussed with Pt, Pt indicates understanding.  Pt denies chest pain at this time.  Will cont to monitor.

## 2016-11-06 NOTE — Progress Notes (Signed)
Patient Name: Bruce Nash      SUBJECTIVE: No interval chest pain. His history giving is interesting as he does not think that  Are in notes i.e. his more recent stress test for which he is able to clarify with specific questioning    Past Medical History:  Diagnosis Date  . CAD (coronary artery disease)   . Diabetes mellitus without complication (HCC)   . Elevated cholesterol   . Hypertension     Scheduled Meds:  Scheduled Meds: . aspirin EC  81 mg Oral Daily  . atorvastatin  80 mg Oral Daily  . clopidogrel  75 mg Oral Daily  . enoxaparin (LOVENOX) injection  40 mg Subcutaneous QHS  . escitalopram  5 mg Oral Daily  . famotidine (PEPCID) IV  20 mg Intravenous Q12H  . insulin aspart  0-5 Units Subcutaneous QHS  . insulin aspart  0-9 Units Subcutaneous TID WC  . isosorbide mononitrate  30 mg Oral Daily  . metoprolol succinate  25 mg Oral Daily   Continuous Infusions: acetaminophen, labetalol, nitroGLYCERIN, ondansetron (ZOFRAN) IV    PHYSICAL EXAM Vitals:   11/06/16 0000 11/06/16 0015 11/06/16 0055 11/06/16 0513  BP: 124/82 122/70 131/69 116/68  Pulse: 88 103 98 76  Resp: 18 18 16 16   Temp:   97.6 F (36.4 C) 98 F (36.7 C)  TempSrc:   Axillary Oral  SpO2: 99% 100% 100% 100%  Weight:   165 lb 11.2 oz (75.2 kg)     Well developed and nourished in no acute distress HENT normal Neck supple with JVP-flat Clear Regular rate and rhythm, no murmurs or gallops Abd-soft with active BS No Clubbing cyanosis edema Skin-warm and dry A & Oriented  Grossly normal sensory and motor function    ECG personally reviewed  Sinus rhythm 74 Intervals 16/08/36 Otherwise normal No intake or output data in the 24 hours ending 11/06/16 1252  LABS: Basic Metabolic Panel:  Recent Labs Lab 11/05/16 2014 11/06/16 0432  NA 143 138  K 3.6 3.5  CL 106 107  CO2 27 20*  GLUCOSE 78 81  BUN 17 14  CREATININE 1.30* 1.05  CALCIUM 10.0 9.0   Cardiac  Enzymes:  Recent Labs  11/06/16 0145 11/06/16 0432 11/06/16 1055  TROPONINI <0.03 <0.03 <0.03   CBC:  Recent Labs Lab 11/05/16 2014  WBC 9.2  HGB 13.6  HCT 41.9  MCV 83.5  PLT 275   PROTIME: No results for input(s): LABPROT, INR in the last 72 hours. Liver Function Tests: No results for input(s): AST, ALT, ALKPHOS, BILITOT, PROT, ALBUMIN in the last 72 hours. No results for input(s): LIPASE, AMYLASE in the last 72 hours. BNP: BNP (last 3 results)  Recent Labs  11/06/16 0145  BNP 70.3     T ASSESSMENT AND PLAN:  Principal Problem:   Chest pain Active Problems:   CAD (coronary artery disease)   HLD (hyperlipidemia)   HTN (hypertension)   Kidney disease   Opacity of lung on imaging study   Depression   GERD (gastroesophageal reflux disease)   Diabetes mellitus type II, non insulin dependent (HCC)   History of coronary artery disease  We had a lengthy discussion regarding the implications of a negative stress test in a patient with high risk pretest probability given his known coronary disease. His risk is somewhat mitigated by his negative troponins in the context of protracted discomfort. He was reminded recommendation to him that he consider catheterization as  this is his second hospitalization for the same problem in 6 months and his pretest likelihood raises the concern about the implications of a false negative stress test having said that, his description of the discomfort with these most recent episode was clearly different from those presentations that prompted stenting.  He is disinclined to submit for catheterization. He would like to go home. He is advised that if he has more discomfort between now and then he should present to the nearest emergency room. We will discharge him.  12;05--12;58   Signed, Sherryl MangesSteven Klein MD  11/06/2016

## 2018-01-25 IMAGING — CR DG CHEST 2V
2 series · 2 of 2 positions shown · non-contrast
Comparison: None.

CLINICAL DATA: Chest pain.

EXAM:
CHEST  2 VIEW

[chest pa]
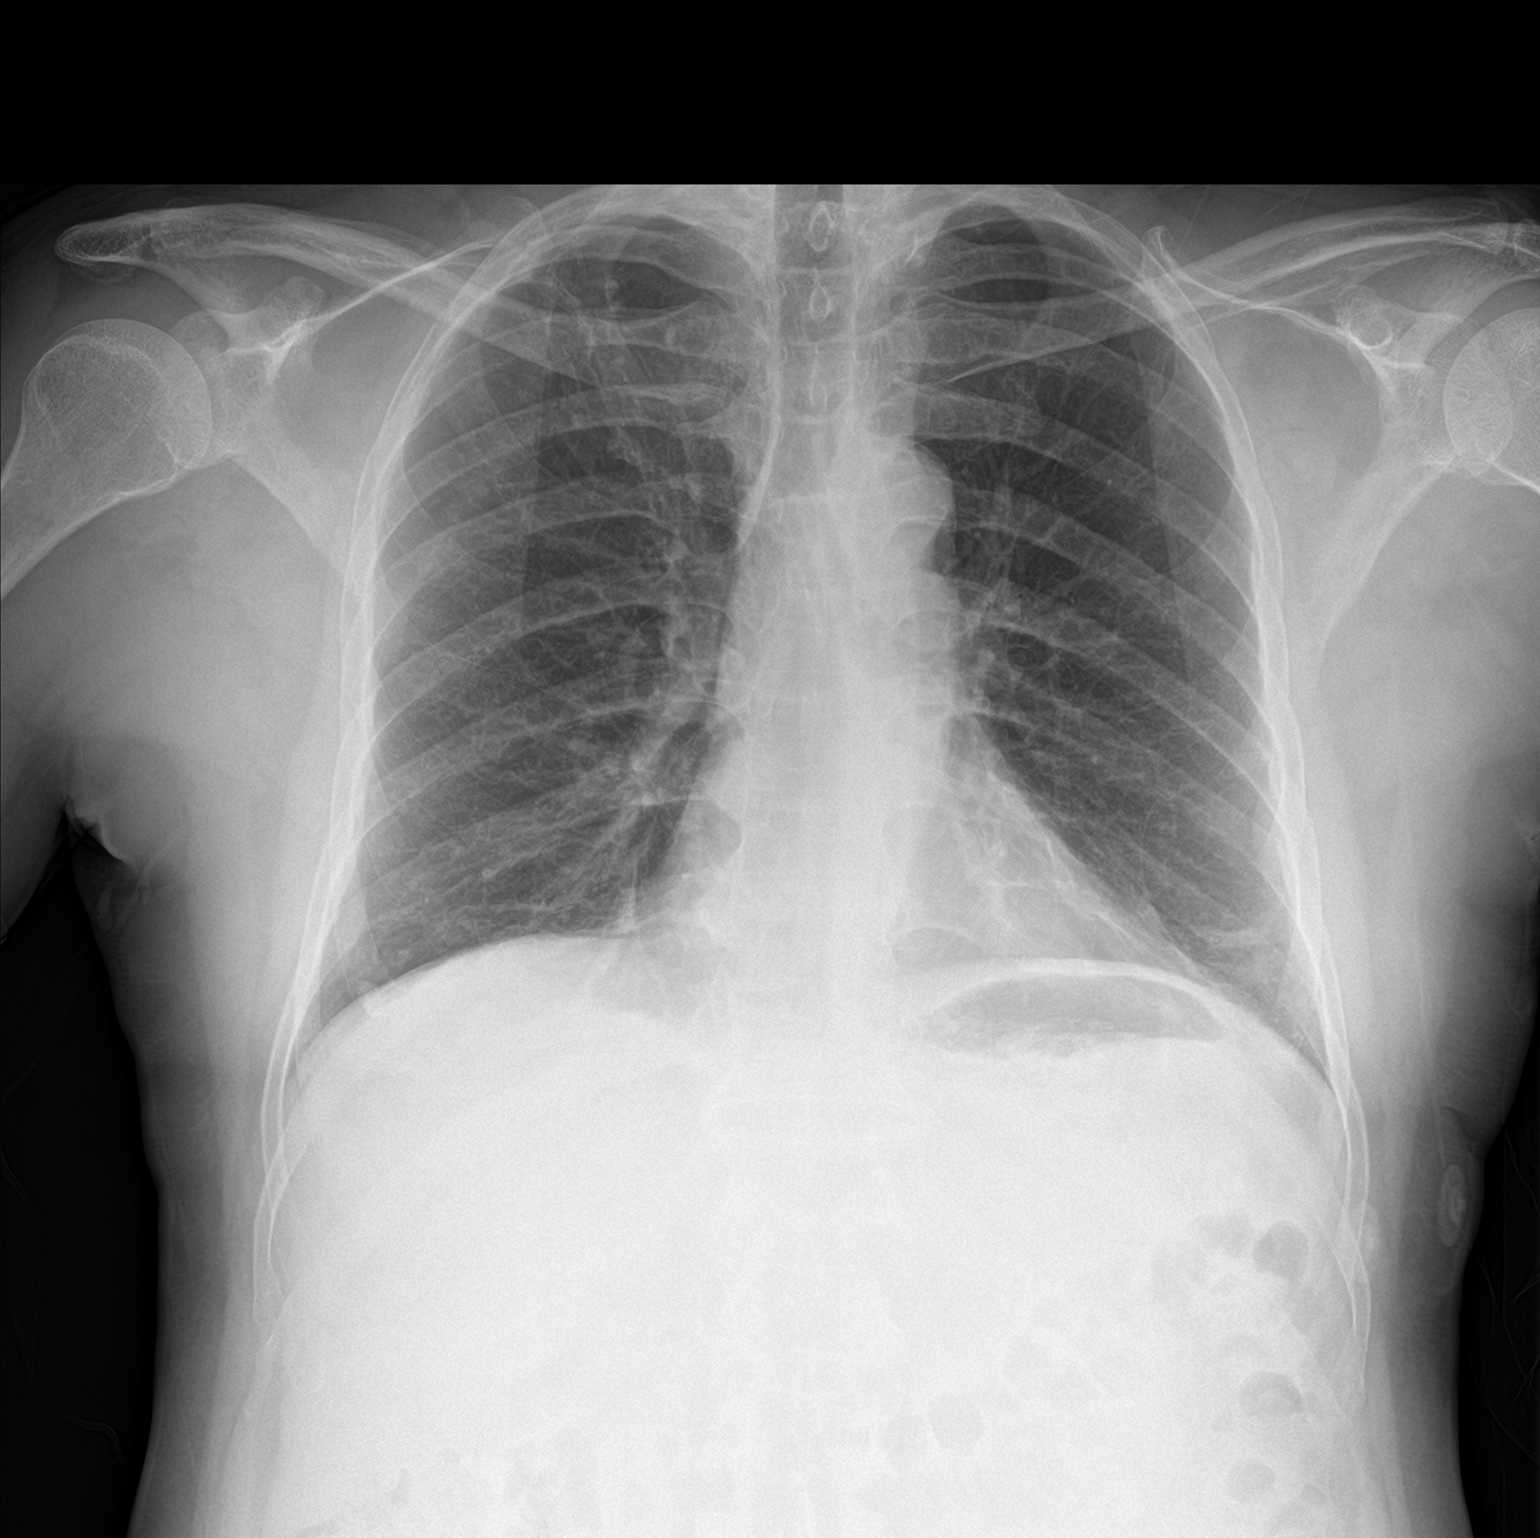

[chest lat]
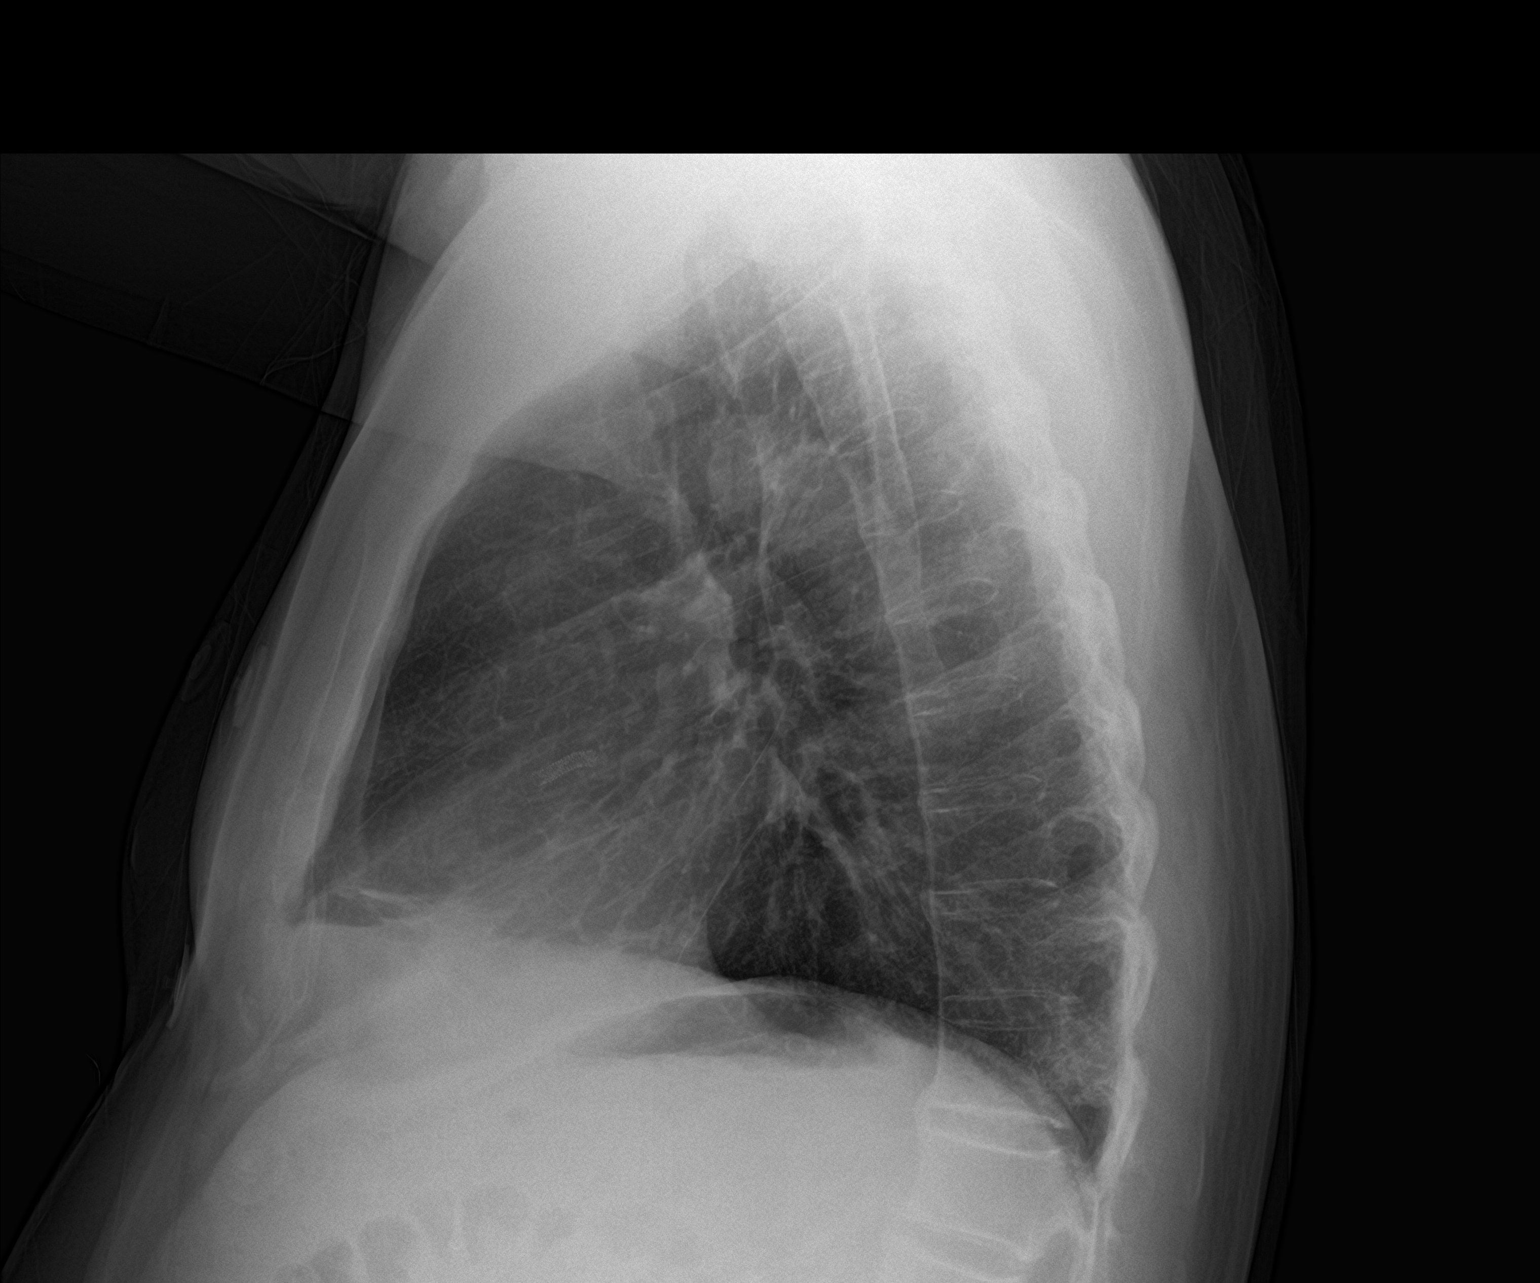

[2 of 2 positions shown; findings below may reference images not displayed]

FINDINGS: Scarring in the right apex. Mild opacity in the lateral left lung
base is nonspecific but may represent scarring or atelectasis. The
heart, hila, and mediastinum are normal. No other acute
abnormalities.
IMPRESSION: Mild nonspecific opacity in the lateral left lung base. Recommend
comparison to outside films if available to assess stability. If
none are available, recommend a short-term follow-up. Probable
scarring in the right apex.
